# Patient Record
Sex: Female | Born: 1970 | Race: White | Hispanic: No | Marital: Married | Smoking: Never smoker
Health system: Southern US, Community
[De-identification: ages and names within clinical notes are randomized; demographics above are authoritative.]

## PROBLEM LIST (undated history)

## (undated) HISTORY — PX: TONSILLECTOMY: SUR1361

## (undated) HISTORY — PX: APPENDECTOMY: SHX54

## (undated) HISTORY — PX: ABLATION: SHX5711

---

## 2018-01-06 ENCOUNTER — Emergency Department (HOSPITAL_BASED_OUTPATIENT_CLINIC_OR_DEPARTMENT_OTHER): Payer: 59

## 2018-01-06 ENCOUNTER — Encounter (HOSPITAL_BASED_OUTPATIENT_CLINIC_OR_DEPARTMENT_OTHER): Payer: Self-pay | Admitting: Emergency Medicine

## 2018-01-06 ENCOUNTER — Other Ambulatory Visit: Payer: Self-pay

## 2018-01-06 ENCOUNTER — Emergency Department (HOSPITAL_BASED_OUTPATIENT_CLINIC_OR_DEPARTMENT_OTHER)
Admission: EM | Admit: 2018-01-06 | Discharge: 2018-01-06 | Disposition: A | Discharge status: Home / Self Care | Payer: 59 | Attending: Emergency Medicine | Admitting: Emergency Medicine

## 2018-01-06 DIAGNOSIS — Y999 Unspecified external cause status: Secondary | ICD-10-CM | POA: Insufficient documentation

## 2018-01-06 DIAGNOSIS — W19XXXA Unspecified fall, initial encounter: Secondary | ICD-10-CM

## 2018-01-06 DIAGNOSIS — S0081XA Abrasion of other part of head, initial encounter: Secondary | ICD-10-CM | POA: Diagnosis not present

## 2018-01-06 DIAGNOSIS — W010XXA Fall on same level from slipping, tripping and stumbling without subsequent striking against object, initial encounter: Secondary | ICD-10-CM | POA: Insufficient documentation

## 2018-01-06 DIAGNOSIS — S80212A Abrasion, left knee, initial encounter: Secondary | ICD-10-CM | POA: Insufficient documentation

## 2018-01-06 DIAGNOSIS — S80211A Abrasion, right knee, initial encounter: Secondary | ICD-10-CM | POA: Insufficient documentation

## 2018-01-06 DIAGNOSIS — Y9302 Activity, running: Secondary | ICD-10-CM | POA: Diagnosis not present

## 2018-01-06 DIAGNOSIS — Y929 Unspecified place or not applicable: Secondary | ICD-10-CM | POA: Diagnosis not present

## 2018-01-06 DIAGNOSIS — S6991XA Unspecified injury of right wrist, hand and finger(s), initial encounter: Secondary | ICD-10-CM | POA: Diagnosis present

## 2018-01-06 DIAGNOSIS — S62346A Nondisplaced fracture of base of fifth metacarpal bone, right hand, initial encounter for closed fracture: Secondary | ICD-10-CM | POA: Insufficient documentation

## 2018-01-06 MED ORDER — HYDROCODONE-ACETAMINOPHEN 5-325 MG PO TABS: 1.0000 | ORAL_TABLET | Freq: Four times a day (QID) | ORAL | 0 refills | Status: AC | PRN

## 2018-01-06 MED ORDER — IBUPROFEN 600 MG PO TABS: 600.0000 mg | ORAL_TABLET | Freq: Four times a day (QID) | ORAL | 0 refills | Status: AC | PRN

## 2018-01-06 MED ORDER — OXYCODONE-ACETAMINOPHEN 5-325 MG PO TABS
1.0000 | ORAL_TABLET | Freq: Once | ORAL | Status: DC
  Filled 2018-01-06: qty 1

## 2018-01-06 MED ORDER — ONDANSETRON HCL 4 MG PO TABS: 4.0000 mg | ORAL_TABLET | Freq: Four times a day (QID) | ORAL | 0 refills | Status: AC

## 2018-01-06 MED ORDER — ONDANSETRON 4 MG PO TBDP
4.0000 mg | ORAL_TABLET | Freq: Once | ORAL | Status: AC
  Administered 2018-01-06: 4 mg via ORAL
  Filled 2018-01-06: qty 1

## 2018-01-06 MED ORDER — HYDROCODONE-ACETAMINOPHEN 5-325 MG PO TABS
2.0000 | ORAL_TABLET | Freq: Once | ORAL | Status: AC
  Administered 2018-01-06: 2 via ORAL
  Filled 2018-01-06: qty 2

## 2018-01-06 NOTE — ED Provider Notes (Signed)
[facility] EMERGENCY DEPARTMENT Provider Note   CSN: [id number] Arrival date & time: 01/06/18  1227     History   Chief Complaint Chief Complaint  Patient presents with  . Fall  . Arm Injury  . Eye Injury  . Knee Injury    HPI Patty Hoover is a 47 y.o. female who presents with right hand pain, right eye swelling, and bilateral knee pain after fall last night.  Patient reports she was running with her dog and the dog ran in front of her and she tripped over the dog.  She hit her face, but did not lose consciousness.  She denies any headache, nausea, vomiting, vision changes.  She broke her fall with her right hand.  She is right-handed.  She has pain to her right wrist and swelling to her right hand and wrist.  She denies any forearm, elbow pain, or shoulder pain.  She denies any neck or back pain.  No medication taken prior to arrival.  Her tetanus is up-to-date per  HPI  History reviewed. No pertinent past medical history.  There are no active problems to display for this patient.   Past Surgical History:  Procedure Laterality Date  . ABLATION    . APPENDECTOMY    . TONSILLECTOMY       OB History   None      Home Medications    Prior to Admission medications   Medication Sig Start Date End Date Taking? Authorizing Provider  HYDROcodone-acetaminophen (NORCO/VICODIN) 5-325 MG tablet Take 1-2 tablets by mouth every 6 (six) hours as needed for severe pain. 01/06/18   Melany Wiesman, Waylan Boga, PA-C  ibuprofen (ADVIL,MOTRIN) 600 MG tablet Take 1 tablet (600 mg total) by mouth every 6 (six) hours as needed. 01/06/18   Adahlia Stembridge, Waylan Boga, PA-C  ondansetron (ZOFRAN) 4 MG tablet Take 1 tablet (4 mg total) by mouth every 6 (six) hours. 01/06/18   Emi Holes, PA-C    Family History No family history on file.  Social History Social History   Tobacco Use  . Smoking status: Never Smoker  . Smokeless tobacco: Never Used  Substance Use Topics  . Alcohol use: Yes    . Drug use: Never     Allergies   Hydrocodone and Oxycodone-acetaminophen   Review of Systems Review of Systems  Musculoskeletal: Positive for arthralgias. Negative for back pain and neck pain.  Skin: Positive for wound.  Neurological: Negative for syncope.     Physical Exam Updated Vital Signs BP (!) 142/100 (BP Location: Left Arm)   Pulse 94   Temp 98.8 F (37.1 C) (Oral)   Resp 17   Ht 5\' 4"  (1.626 m)   Wt 72.6 kg   SpO2 98%   BMI 27.46 kg/m   Physical Exam  Constitutional: She appears well-developed and well-nourished. No distress.  HENT:  Head: Normocephalic and atraumatic.    Mouth/Throat: Oropharynx is clear and moist. No oropharyngeal exudate.  Eyes: Pupils are equal, round, and reactive to light. Conjunctivae and EOM are normal. Right eye exhibits no discharge. Left eye exhibits no discharge. No scleral icterus.  Neck: Normal range of motion. Neck supple. No thyromegaly present.  Cardiovascular: Normal rate, regular rhythm, normal heart sounds and intact distal pulses. Exam reveals no gallop and no friction rub.  No murmur heard. Pulmonary/Chest: Effort normal and breath sounds normal. No stridor. No respiratory distress. She has no wheezes. She has no rales.  Abdominal: Soft. Bowel sounds are normal. She  exhibits no distension. There is no tenderness. There is no rebound and no guarding.  Musculoskeletal: She exhibits no edema.  Tenderness to bilateral anterior knees with effusions noted to the prepatellar areas as well as abrasions Tenderness to the metacarpals of the fourth and fifth fingers on the right hand negative, sensation intact, cap refill less than 2 seconds, abrasions noted, range of motion limited due to pain, mild ulnar deviation of the 5th digit noted with closed fists  Lymphadenopathy:    She has no cervical adenopathy.  Neurological: She is alert. Coordination normal.  Skin: Skin is warm and dry. No rash noted. She is not diaphoretic. No  pallor.  Psychiatric: She has a normal mood and affect.  Nursing note and vitals reviewed.    ED Treatments / Results  Labs (all labs ordered are listed, but only abnormal results are displayed) Labs Reviewed - No data to display  EKG None  Radiology Dg Wrist Complete Right  Result Date: 01/06/2018 CLINICAL DATA:  Fall onto right hand.  Pain EXAM: RIGHT WRIST - COMPLETE 3+ VIEW COMPARISON:  None. FINDINGS: There is a fracture at the base of the right 5th metacarpal. No additional fracture seen. No subluxation or dislocation. Joint spaces maintained. IMPRESSION: Fracture the base of the right 5th metacarpal. Electronically Signed   By: Charlett Nose M.D.   On: 01/06/2018 13:02   Dg Knee Complete 4 Views Left  Result Date: 01/06/2018 CLINICAL DATA:  Knee pain after being pulled down while walking dog and landing on knees. EXAM: LEFT KNEE - COMPLETE 4+ VIEW COMPARISON:  None. FINDINGS: No evidence of fracture, dislocation, or joint effusion. No evidence of arthropathy or other focal bone abnormality. Soft tissues are unremarkable. IMPRESSION: Negative. Electronically Signed   By: Tollie Eth M.D.   On: 01/06/2018 16:03   Dg Knee Complete 4 Views Right  Result Date: 01/06/2018 CLINICAL DATA:  Knee pain after fall while walking dog. EXAM: RIGHT KNEE - COMPLETE 4+ VIEW COMPARISON:  None. FINDINGS: No evidence of fracture, dislocation, or joint effusion. No evidence of arthropathy or other focal bone abnormality. Soft tissues are unremarkable. IMPRESSION: Negative. Electronically Signed   By: Tollie Eth M.D.   On: 01/06/2018 16:04   Dg Hand Complete Right  Result Date: 01/06/2018 CLINICAL DATA:  Fall, landing on right hand EXAM: RIGHT HAND - COMPLETE 3+ VIEW COMPARISON:  None. FINDINGS: Fracture at the base of the right 5th metacarpal. No subluxation or dislocation. Overlying soft tissue swelling. No additional acute bony abnormality. IMPRESSION: Fracture at the base of the right 5th  metacarpal. Electronically Signed   By: Charlett Nose M.D.   On: 01/06/2018 13:01    Procedures Procedures (including critical care time)  SPLINT APPLICATION Date/Time: 6:49 PM Authorized by: Emi Holes Consent: Verbal consent obtained. Risks and benefits: risks, benefits and alternatives were discussed Consent given by: patient Splint applied by: EMT Location details: R wrist Splint type: Ulnar gutter Supplies used: Orthoglass, ACE Post-procedure: The splinted body part was neurovascularly unchanged following the procedure. Patient tolerance: Patient tolerated the procedure well with no immediate complications.     Medications Ordered in ED Medications  HYDROcodone-acetaminophen (NORCO/VICODIN) 5-325 MG per tablet 2 tablet (2 tablets Oral Given 01/06/18 1536)  ondansetron (ZOFRAN-ODT) disintegrating tablet 4 mg (4 mg Oral Given 01/06/18 1536)     Initial Impression / Assessment and Plan / ED Course  I have reviewed the triage vital signs and the nursing notes.  Pertinent labs & imaging results  that were available during my care of the patient were reviewed by me and considered in my medical decision making (see chart for details).     Patient presenting with right wrist pain, bilateral knee pain, and right eye swelling after fall yesterday evening.  Patient did not lose consciousness.  Most of her pain is in her right wrist.  X-ray shows fracture to the base of the fifth meta carpal on the right.  Bilateral knee x-rays are negative.  Patient has no tenderness on the face, only mild edema.  No indication for imaging.  She has no vision changes or problems with EOMs.  Patient is neurovascularly intact.  She does have mild ulnar deviation of the fifth digit.  Patient placed in ulnar gutter splint and will give follow-up to Dr. Dion Saucier who is on-call for hand surgery today.  Patient discharged home with short course of Percocet.  I reviewed the [place] narcotic database and found no  discrepancies.  Alternate with NSAIDs.  Zofran as needed if Percocet causes nausea.  Return precautions discussed.  Patient understands and agrees with plan.  Patient vitals stable throughout ED course and discharged in satisfactory condition. I discussed patient case with Dr. Denton Lank who guided the patient's management and agrees with plan.   Final Clinical Impressions(s) / ED Diagnoses   Final diagnoses:  Fall, initial encounter  Closed nondisplaced fracture of base of fifth metacarpal bone of right hand, initial encounter    ED Discharge Orders         Ordered    HYDROcodone-acetaminophen (NORCO/VICODIN) 5-325 MG tablet  Every 6 hours PRN     01/06/18 1656    ibuprofen (ADVIL,MOTRIN) 600 MG tablet  Every 6 hours PRN     01/06/18 1656    ondansetron (ZOFRAN) 4 MG tablet  Every 6 hours     01/06/18 1656           Naji Mehringer, Waylan Boga, PA-C 01/06/18 1849    Cathren Laine, MD 01/06/18 1902

## 2018-01-06 NOTE — ED Triage Notes (Signed)
Pt reports out running with her dog when she tripped and fell causing injury to her right eye, right arm, right hand and B/L knees. Pt presents with abrasion to right eye area, bleeding controlled.

## 2018-01-06 NOTE — ED Notes (Signed)
Right hand cleaned with sound wash  Ice pack applied to right eye

## 2018-01-06 NOTE — Discharge Instructions (Addendum)
You can take ibuprofen as prescribed for your pain.  For severe pain, you can take 1-2 Norco every 6 hours.  If you develop nausea or vomiting from this medication, you can take Zofran every 6 hours.  Use ice 3-4 times daily alternating 20 minutes on, 20 minutes off.  Keep your splint applied until you see the orthopedic doctor.  Please call the orthopedic doctor tomorrow to make an appointment.  Please return the emergency department if you develop any new or worsening symptoms including complete numbness of your hand or fingers, color change, or if you get your splint wet.  Do not drink alcohol, drive, operate machinery or participate in any other potentially dangerous activities while taking opiate pain medication as it may make you sleepy. Do not take this medication with any other sedating medications, either prescription or over-the-counter. If you were prescribed Percocet or Vicodin, do not take these with acetaminophen (Tylenol) as it is already contained within these medications and overdose of Tylenol is dangerous.   This medication is an opiate (or narcotic) pain medication and can be habit forming.  Use it as little as possible to achieve adequate pain control.  Do not use or use it with extreme caution if you have a history of opiate abuse or dependence. This medication is intended for your use only - do not give any to anyone else and keep it in a secure place where nobody else, especially children, have access to it. It will also cause or worsen constipation, so you may want to consider taking an over-the-counter stool softener while you are taking this medication.

## 2018-01-06 NOTE — ED Notes (Signed)
Signature pad not working, pt verbalized understanding of d/c instructions.  

## 2019-09-29 IMAGING — CR DG WRIST COMPLETE 3+V*R*
4 series · 4 of 4 positions shown · non-contrast
Comparison: None.

CLINICAL DATA: Fall onto right hand.  Pain

EXAM:
RIGHT WRIST - COMPLETE 3+ VIEW

[x wrist pa right]
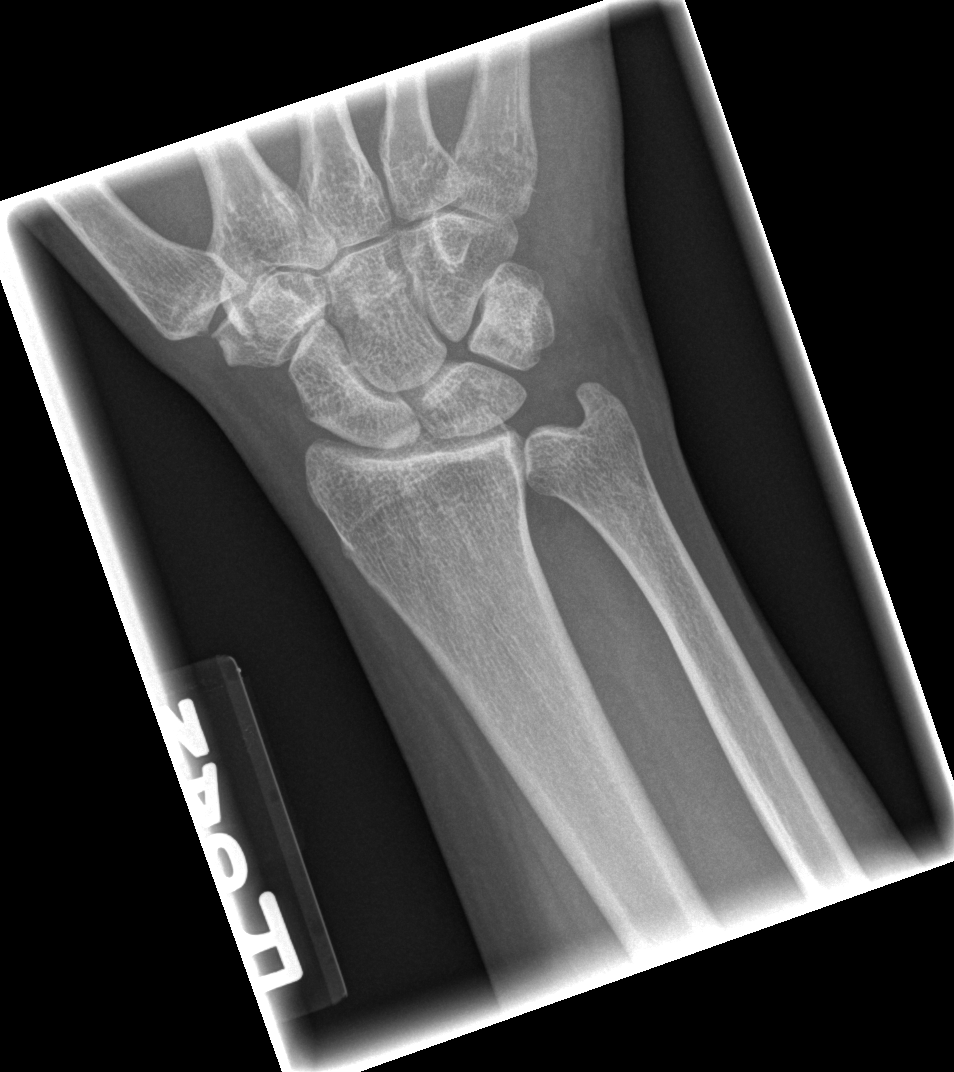

[x wrist obl right]
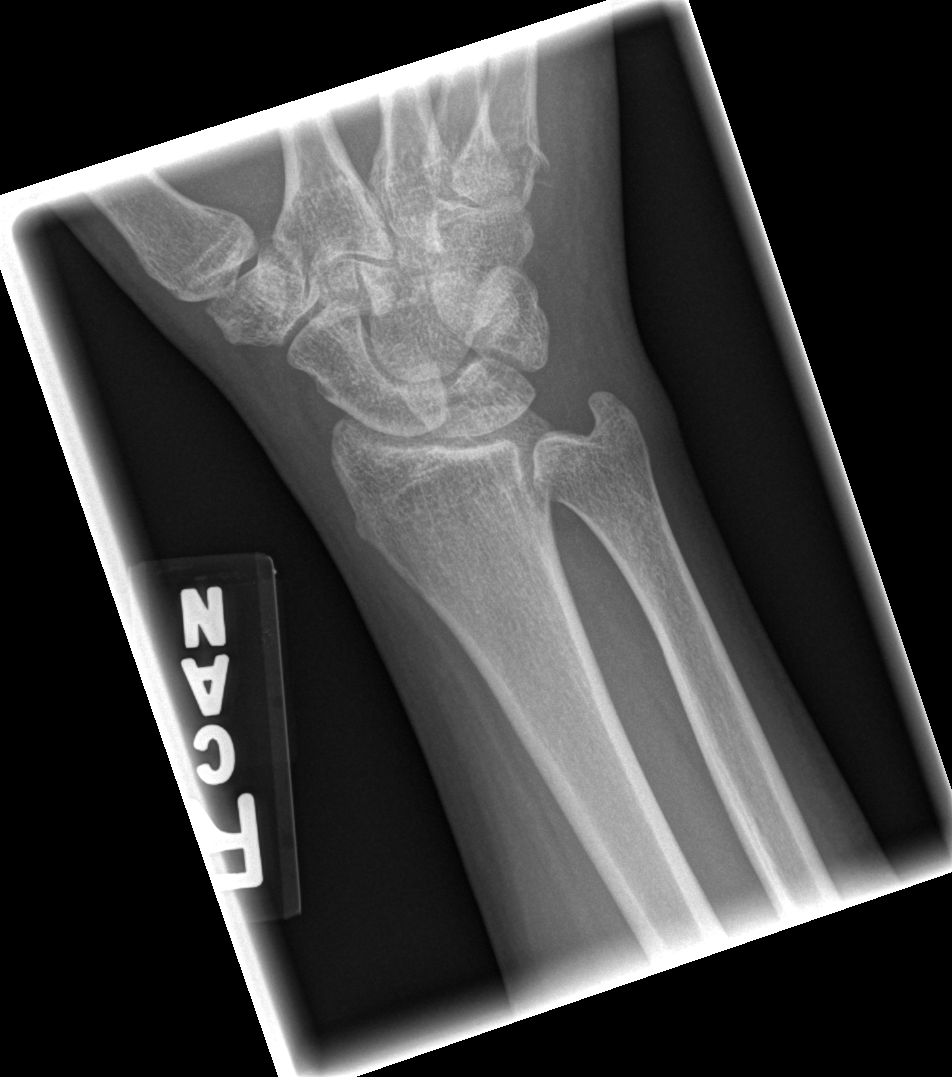

[x wrist lat right]
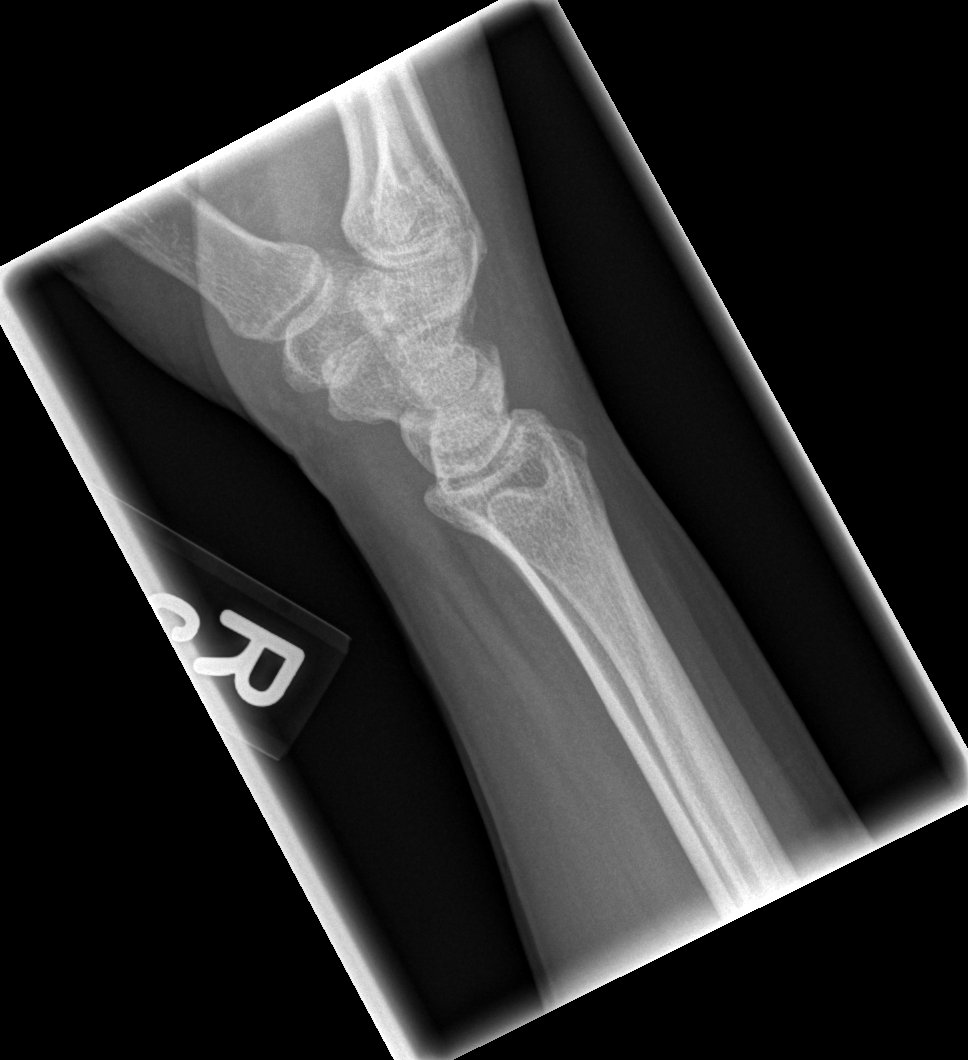

[x navicular *]
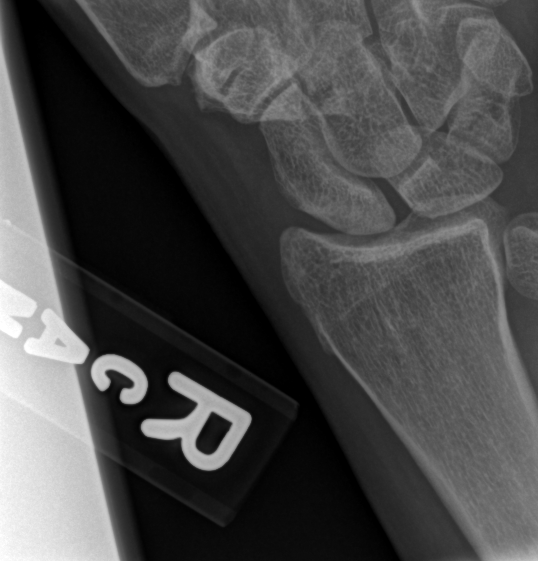

[4 of 4 positions shown; findings below may reference images not displayed]

FINDINGS: There is a fracture at the base of the right 5th metacarpal. No
additional fracture seen. No subluxation or dislocation. Joint
spaces maintained.
IMPRESSION: Fracture the base of the right 5th metacarpal.

## 2019-09-29 IMAGING — CR DG HAND COMPLETE 3+V*R*
3 series · 3 of 3 positions shown · non-contrast
Comparison: None.

CLINICAL DATA: Fall, landing on right hand

EXAM:
RIGHT HAND - COMPLETE 3+ VIEW

[x hand pa right]
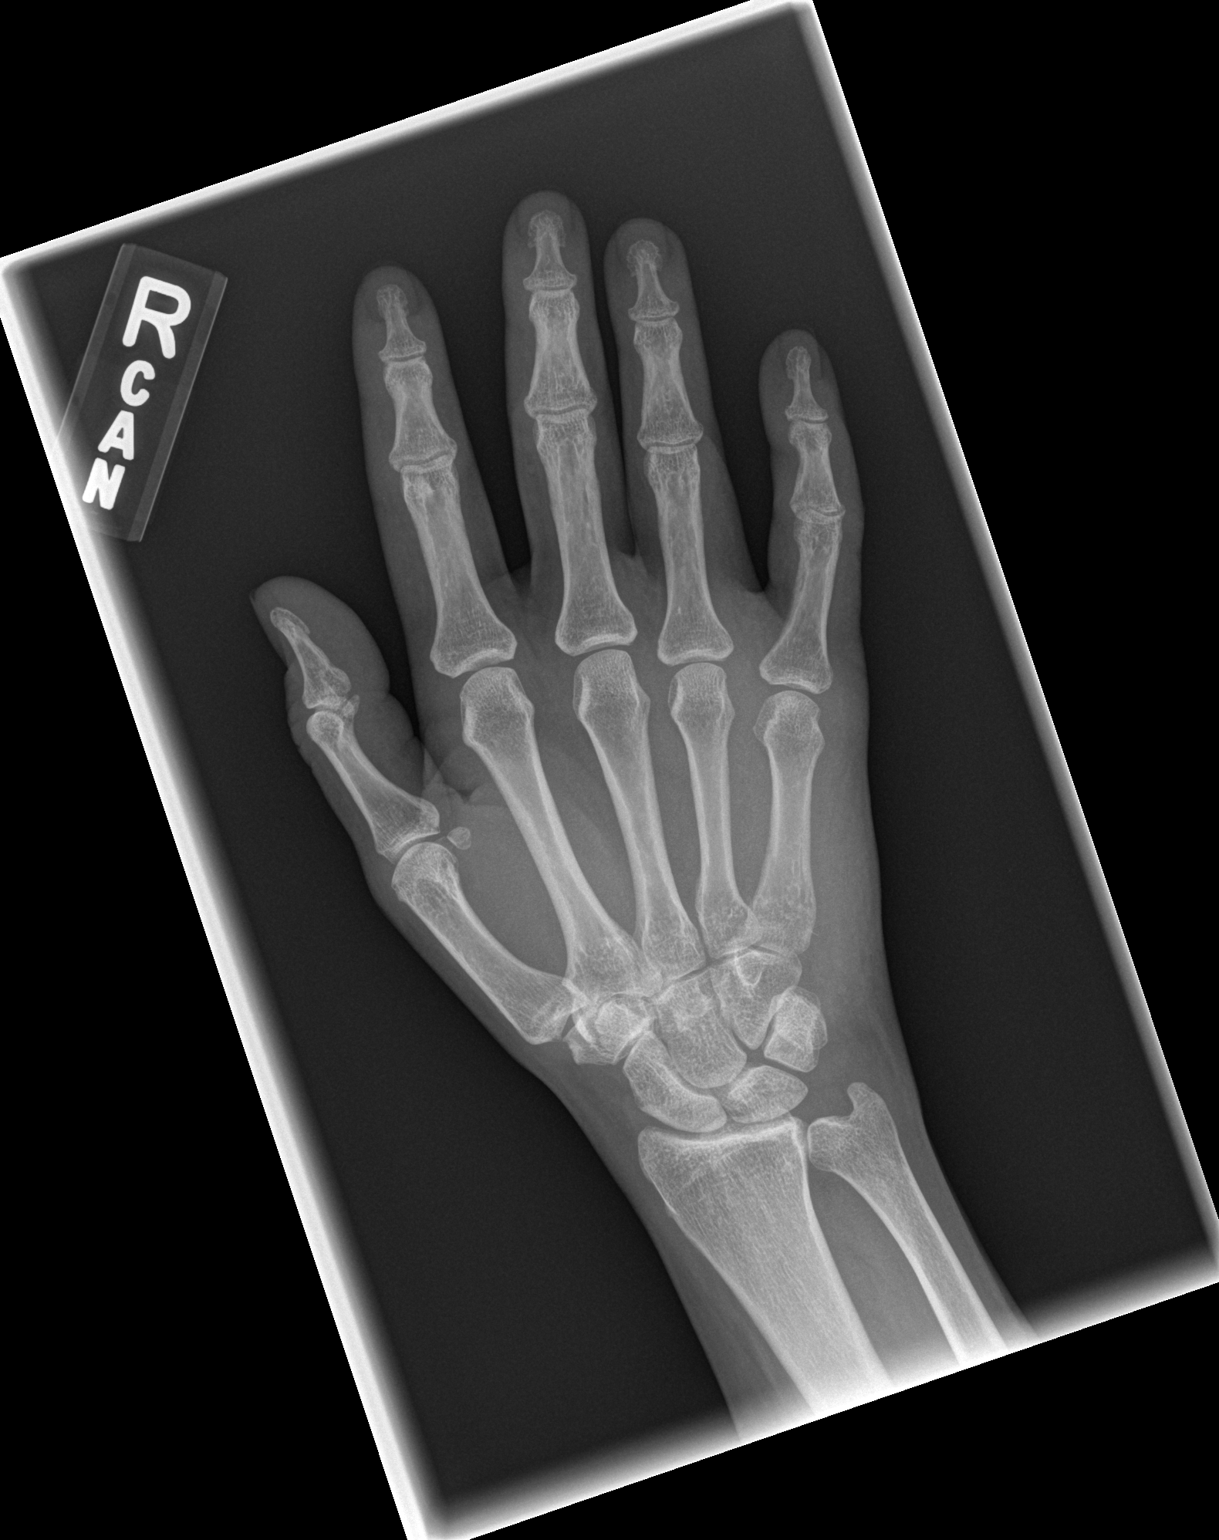

[x hand oblique right]
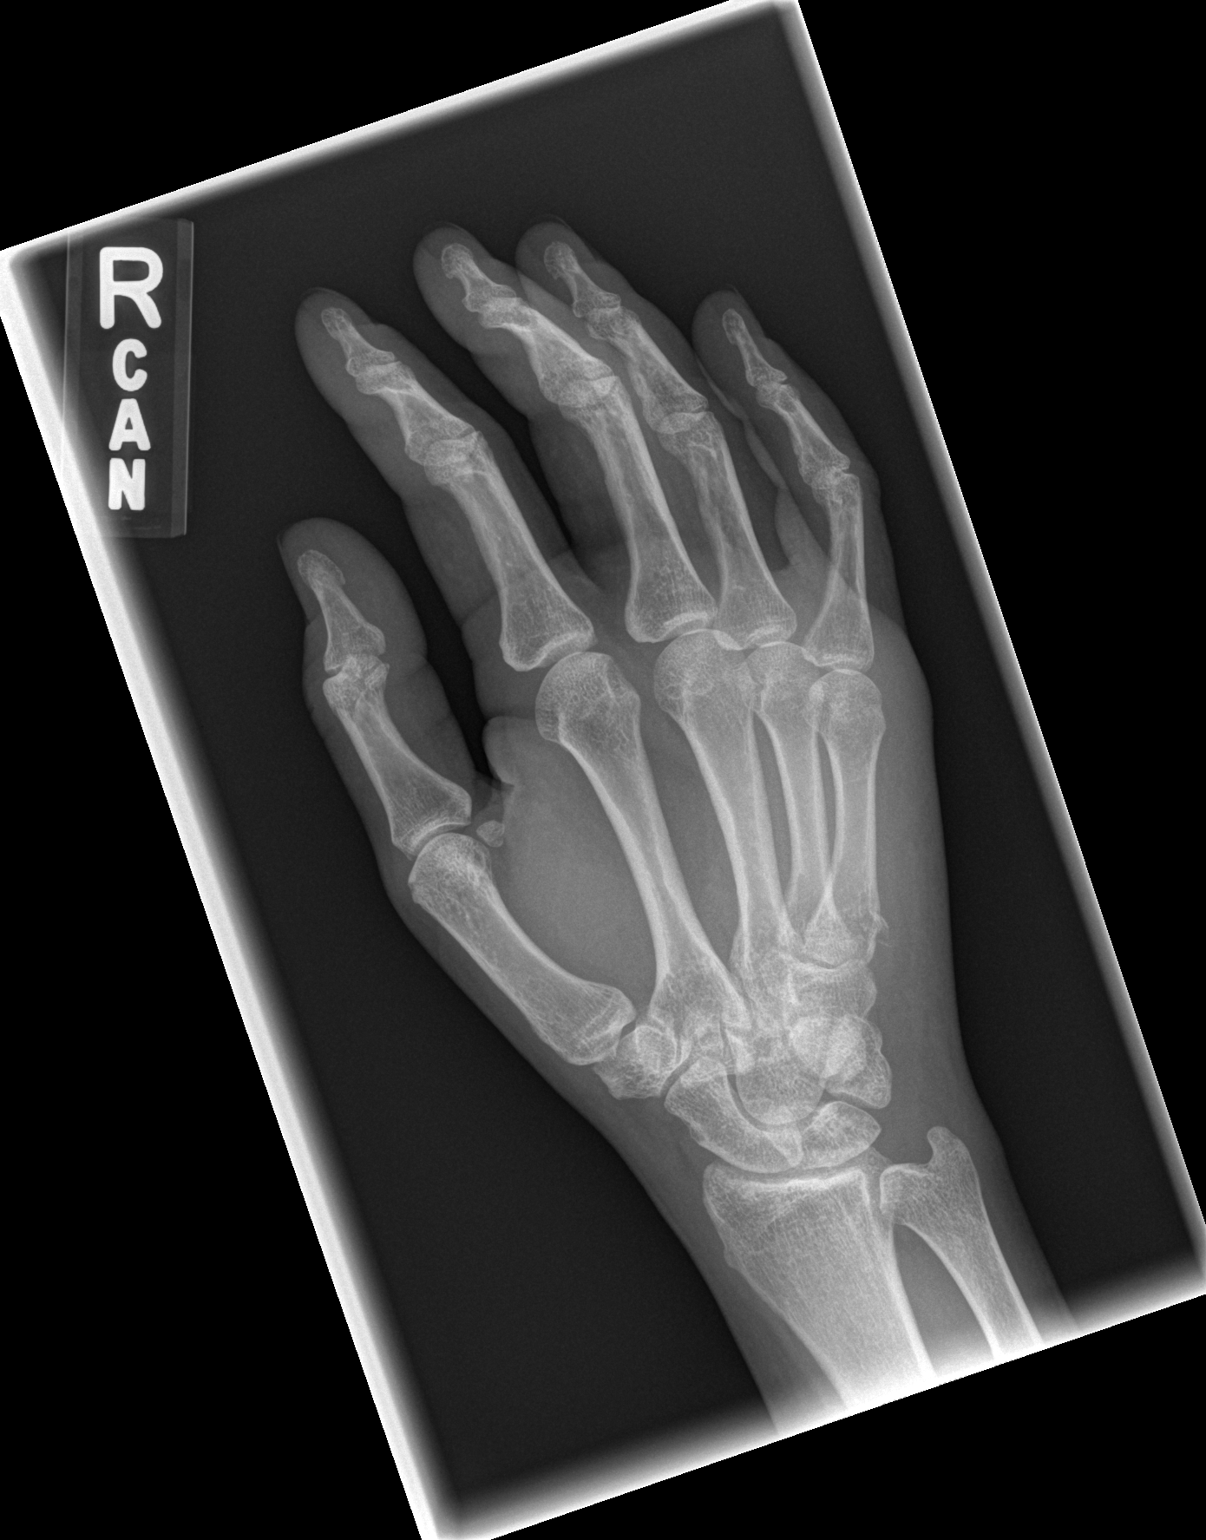

[x hand lat right]
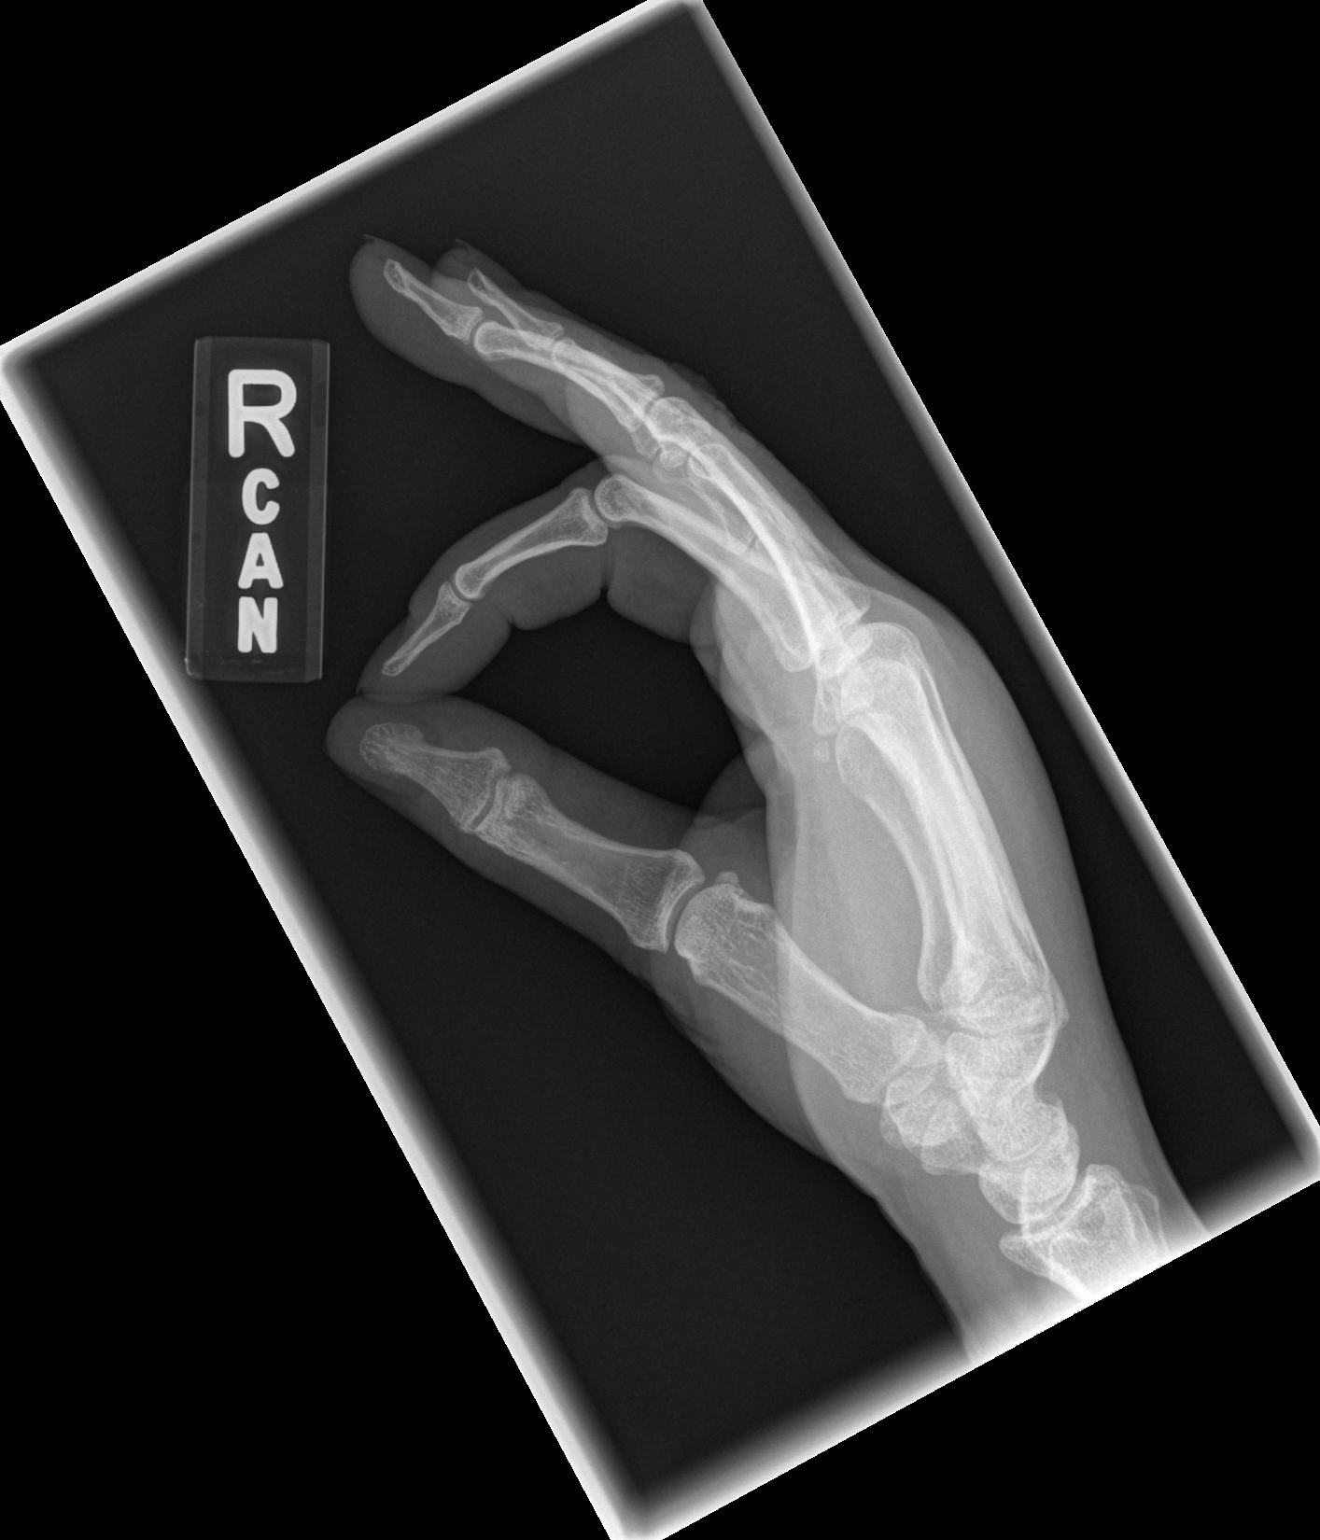

[3 of 3 positions shown; findings below may reference images not displayed]

FINDINGS: Fracture at the base of the right 5th metacarpal. No subluxation or
dislocation. Overlying soft tissue swelling. No additional acute
bony abnormality.
IMPRESSION: Fracture at the base of the right 5th metacarpal.

## 2019-09-29 IMAGING — DX DG KNEE COMPLETE 4+V*L*
4 series · 4 of 4 positions shown · non-contrast
Comparison: None.

CLINICAL DATA: Knee pain after being pulled down while walking dog
and landing on knees.

EXAM:
LEFT KNEE - COMPLETE 4+ VIEW

[knee ap]
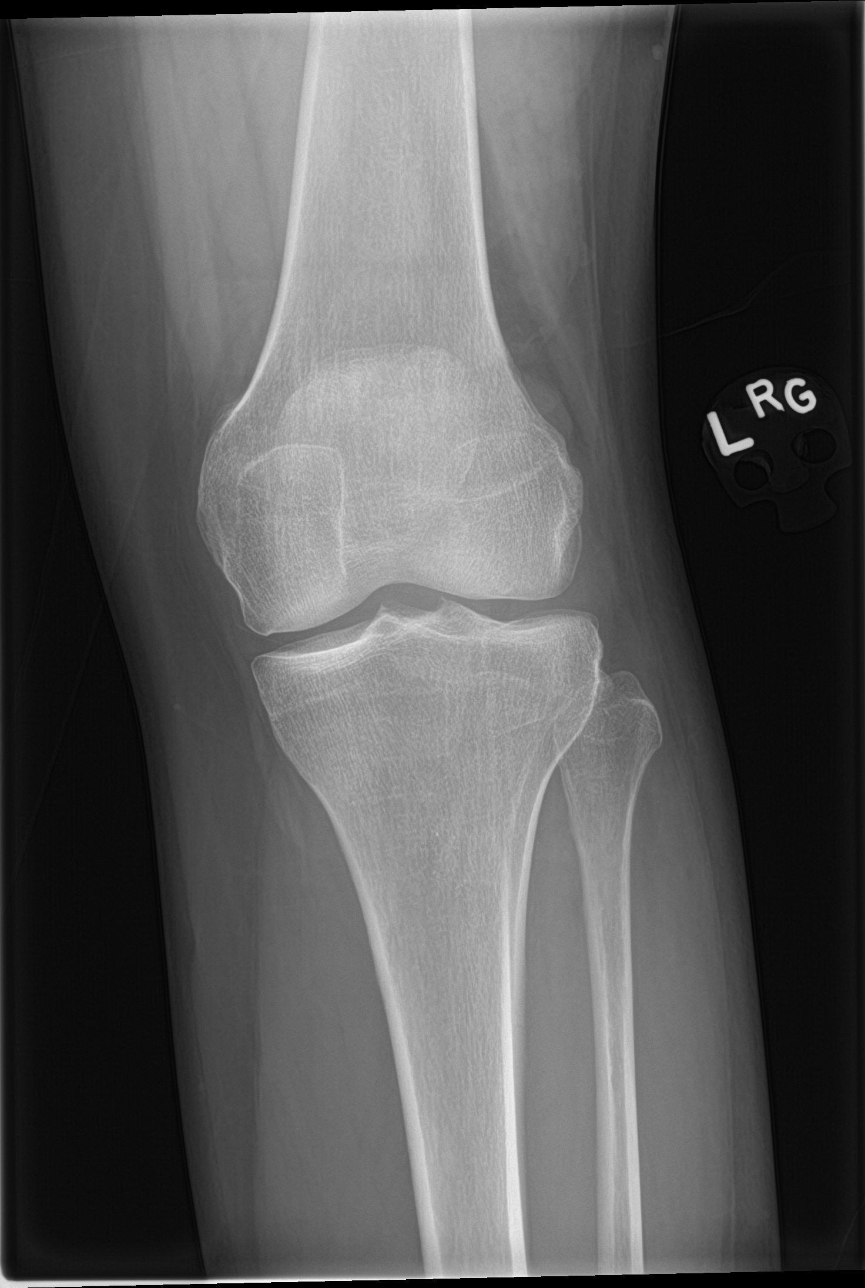

[knee lat]
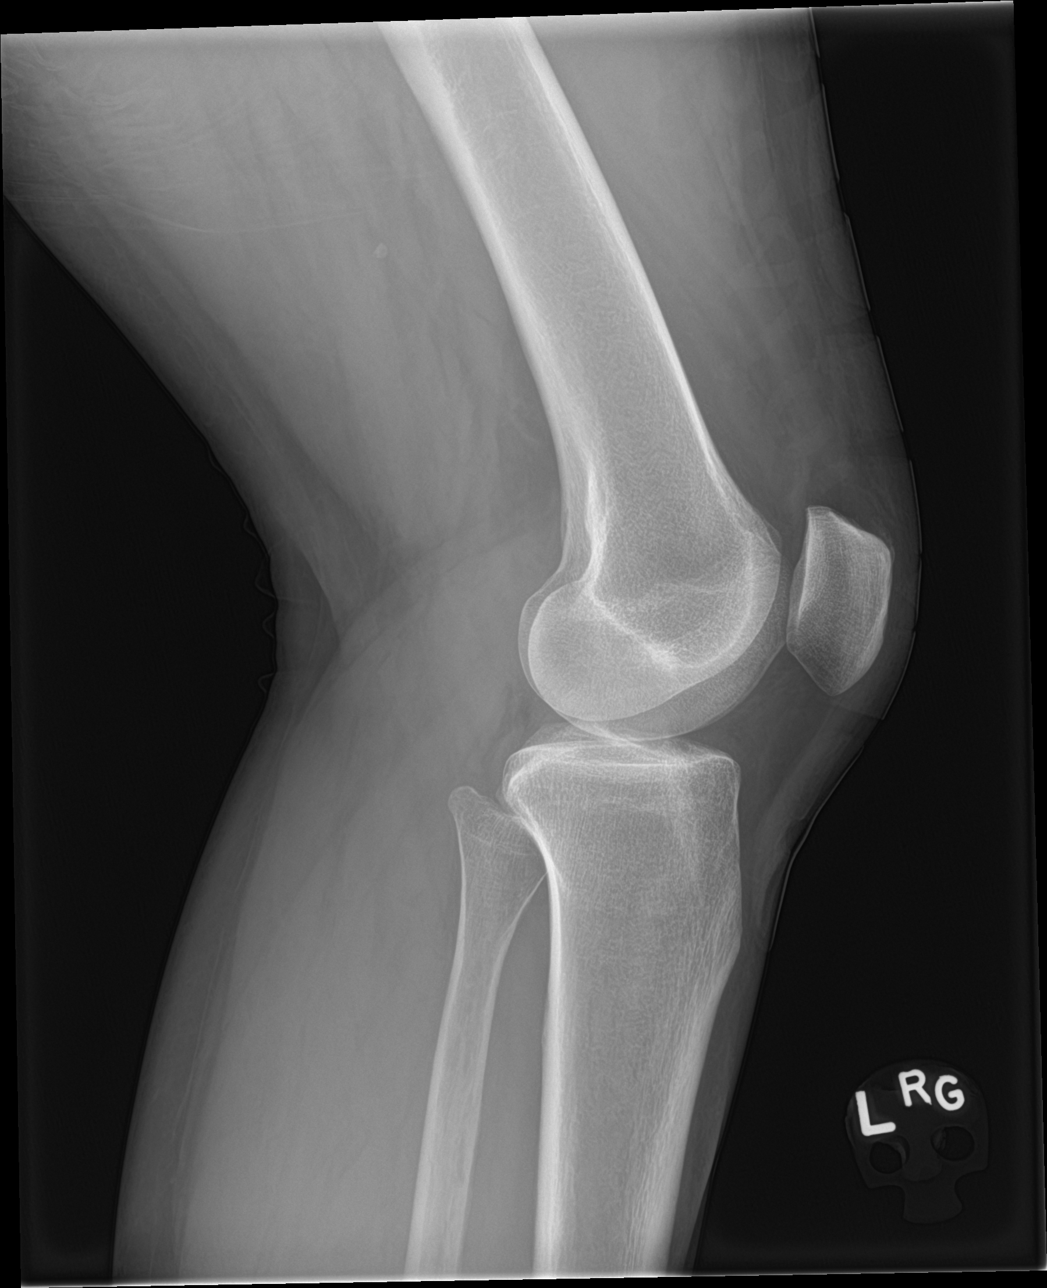

[knee obl (1 of 2)]
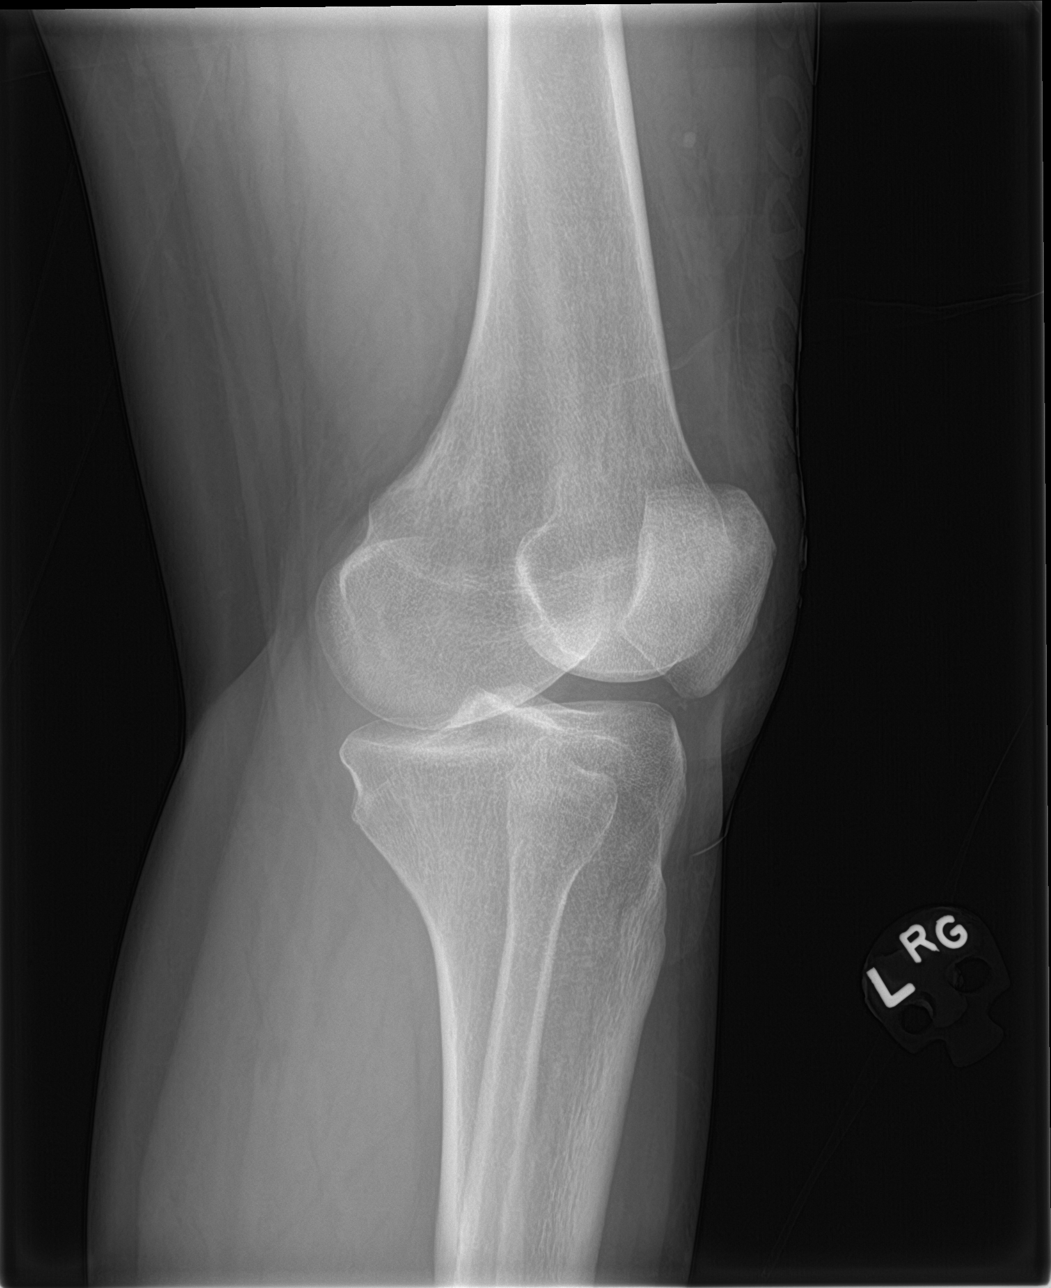

[knee obl (2 of 2)]
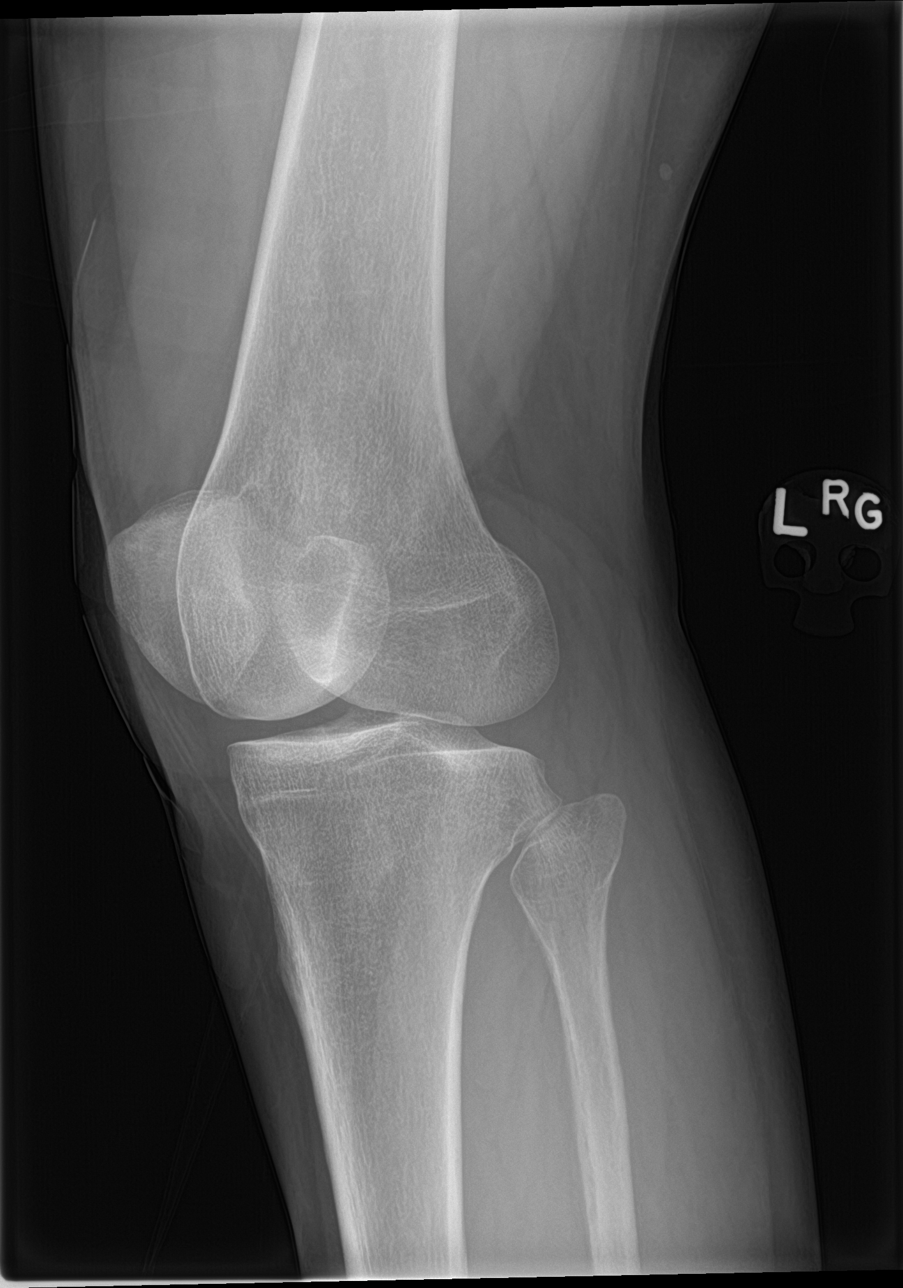

[4 of 4 positions shown; findings below may reference images not displayed]

FINDINGS: No evidence of fracture, dislocation, or joint effusion. No evidence
of arthropathy or other focal bone abnormality. Soft tissues are
unremarkable.
IMPRESSION: Negative.
